# Patient Record
Sex: Female | Born: 1935 | Race: White | Hispanic: No | Marital: Married
Health system: Southern US, Community
[De-identification: ages and names within clinical notes are randomized; demographics above are authoritative.]

---

## 2001-01-26 ENCOUNTER — Encounter: Payer: Self-pay | Admitting: Anesthesiology

## 2001-02-02 ENCOUNTER — Inpatient Hospital Stay (HOSPITAL_COMMUNITY): Admission: RE | Admit: 2001-02-02 | Discharge: 2001-02-04 | Payer: Self-pay | Admitting: Obstetrics and Gynecology

## 2005-12-20 ENCOUNTER — Inpatient Hospital Stay (HOSPITAL_COMMUNITY): Admission: AC | Admit: 2005-12-20 | Discharge: 2005-12-20 | Payer: Self-pay
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
## 2014-01-24 NOTE — Care Management Note (Unsigned)
    Page 1 of 1   01/24/2014     3:53:16 PM CARE MANAGEMENT NOTE 01/24/2014  Patient:  Erika Ferguson, Erika Ferguson   Account Number:  [id number]  Date Initiated:  01/24/2014  Documentation initiated by:  HUTCHINSON,CRYSTAL  Subjective/Objective Assessment:   note entered in error     Action/Plan:   Anticipated DC Date:     Anticipated DC Plan:           Choice offered to / List presented to:             Status of service:   Medicare Important Message given?   (If response is "NO", the following Medicare IM given date fields will be blank) Date Medicare IM given:   Medicare IM given by:   Date Additional Medicare IM given:   Additional Medicare IM given by:    Discharge Disposition:    Per UR Regulation:    If discussed at Long Length of Stay Meetings, dates discussed:    Comments:

## 2016-05-02 DIAGNOSIS — I6350 Cerebral infarction due to unspecified occlusion or stenosis of unspecified cerebral artery: Secondary | ICD-10-CM | POA: Diagnosis not present

## 2016-05-02 DIAGNOSIS — E119 Type 2 diabetes mellitus without complications: Secondary | ICD-10-CM

## 2016-05-02 DIAGNOSIS — I739 Peripheral vascular disease, unspecified: Secondary | ICD-10-CM | POA: Diagnosis not present

## 2016-05-02 DIAGNOSIS — J449 Chronic obstructive pulmonary disease, unspecified: Secondary | ICD-10-CM

## 2016-05-02 DIAGNOSIS — E785 Hyperlipidemia, unspecified: Secondary | ICD-10-CM

## 2016-05-02 DIAGNOSIS — N183 Chronic kidney disease, stage 3 (moderate): Secondary | ICD-10-CM

## 2016-05-02 DIAGNOSIS — I10 Essential (primary) hypertension: Secondary | ICD-10-CM | POA: Diagnosis not present

## 2016-05-03 DIAGNOSIS — I739 Peripheral vascular disease, unspecified: Secondary | ICD-10-CM | POA: Diagnosis not present

## 2016-05-03 DIAGNOSIS — E119 Type 2 diabetes mellitus without complications: Secondary | ICD-10-CM | POA: Diagnosis not present

## 2016-05-03 DIAGNOSIS — I10 Essential (primary) hypertension: Secondary | ICD-10-CM | POA: Diagnosis not present

## 2016-05-03 DIAGNOSIS — I6350 Cerebral infarction due to unspecified occlusion or stenosis of unspecified cerebral artery: Secondary | ICD-10-CM | POA: Diagnosis not present

## 2016-05-04 DIAGNOSIS — I10 Essential (primary) hypertension: Secondary | ICD-10-CM | POA: Diagnosis not present

## 2016-05-04 DIAGNOSIS — I6350 Cerebral infarction due to unspecified occlusion or stenosis of unspecified cerebral artery: Secondary | ICD-10-CM | POA: Diagnosis not present

## 2016-05-04 DIAGNOSIS — E119 Type 2 diabetes mellitus without complications: Secondary | ICD-10-CM | POA: Diagnosis not present

## 2016-05-04 DIAGNOSIS — I739 Peripheral vascular disease, unspecified: Secondary | ICD-10-CM | POA: Diagnosis not present

## 2016-05-05 DIAGNOSIS — E119 Type 2 diabetes mellitus without complications: Secondary | ICD-10-CM | POA: Diagnosis not present

## 2016-05-05 DIAGNOSIS — I6350 Cerebral infarction due to unspecified occlusion or stenosis of unspecified cerebral artery: Secondary | ICD-10-CM | POA: Diagnosis not present

## 2016-05-05 DIAGNOSIS — I739 Peripheral vascular disease, unspecified: Secondary | ICD-10-CM | POA: Diagnosis not present

## 2016-05-05 DIAGNOSIS — I10 Essential (primary) hypertension: Secondary | ICD-10-CM | POA: Diagnosis not present

## 2016-05-06 DIAGNOSIS — I6350 Cerebral infarction due to unspecified occlusion or stenosis of unspecified cerebral artery: Secondary | ICD-10-CM | POA: Diagnosis not present

## 2016-05-06 DIAGNOSIS — E119 Type 2 diabetes mellitus without complications: Secondary | ICD-10-CM | POA: Diagnosis not present

## 2016-05-06 DIAGNOSIS — I10 Essential (primary) hypertension: Secondary | ICD-10-CM | POA: Diagnosis not present

## 2016-05-06 DIAGNOSIS — I739 Peripheral vascular disease, unspecified: Secondary | ICD-10-CM | POA: Diagnosis not present

## 2016-05-07 DIAGNOSIS — I6350 Cerebral infarction due to unspecified occlusion or stenosis of unspecified cerebral artery: Secondary | ICD-10-CM | POA: Diagnosis not present

## 2016-05-07 DIAGNOSIS — E119 Type 2 diabetes mellitus without complications: Secondary | ICD-10-CM | POA: Diagnosis not present

## 2016-05-07 DIAGNOSIS — I739 Peripheral vascular disease, unspecified: Secondary | ICD-10-CM | POA: Diagnosis not present

## 2016-05-07 DIAGNOSIS — I10 Essential (primary) hypertension: Secondary | ICD-10-CM | POA: Diagnosis not present

## 2020-10-11 DEATH — deceased
